# Patient Record
Sex: Male | Born: 1965 | Race: Black or African American | Hispanic: No | Marital: Married | Smoking: Never smoker
Health system: Southern US, Community
[De-identification: ages and names within clinical notes are randomized; demographics above are authoritative.]

## PROBLEM LIST (undated history)

## (undated) DIAGNOSIS — J302 Other seasonal allergic rhinitis: Secondary | ICD-10-CM

---

## 2008-02-24 ENCOUNTER — Encounter: Admission: RE | Admit: 2008-02-24 | Discharge: 2008-02-24 | Payer: Self-pay | Admitting: Occupational Medicine

## 2013-03-31 ENCOUNTER — Emergency Department (INDEPENDENT_AMBULATORY_CARE_PROVIDER_SITE_OTHER)
Admission: EM | Admit: 2013-03-31 | Discharge: 2013-03-31 | Disposition: A | Discharge status: Home / Self Care | Payer: Self-pay | Source: Home / Self Care | Attending: Family Medicine | Admitting: Family Medicine

## 2013-03-31 ENCOUNTER — Encounter: Payer: Self-pay | Admitting: *Deleted

## 2013-03-31 DIAGNOSIS — S6991XA Unspecified injury of right wrist, hand and finger(s), initial encounter: Secondary | ICD-10-CM

## 2013-03-31 DIAGNOSIS — S6980XA Other specified injuries of unspecified wrist, hand and finger(s), initial encounter: Secondary | ICD-10-CM

## 2013-03-31 MED ORDER — CEPHALEXIN 500 MG PO CAPS: 500.0000 mg | ORAL_CAPSULE | Freq: Three times a day (TID) | ORAL | Status: DC

## 2013-03-31 NOTE — ED Notes (Signed)
Patient states he sliced the top of right middle knuckle at work 2 wks ago. Has tried OTC Neosporin and band aid with no relief.

## 2013-03-31 NOTE — ED Provider Notes (Signed)
History     CSN: [id number]  Arrival date & time 03/31/13  1724   First MD Initiated Contact with Patient 03/31/13 1752      Chief Complaint  Patient presents with  . Finger Injury   HPI Finger injury 2 weeks ago  Pt was moving steel and cut dorsal surface of R middle knuckle.  Superficial wound.  Initially placed bandage over area after cleaning.  Area would partially heal and then reopen after pt flexed knuckle.  No fevers, chills.  No purulent drainage, redness, tenderness.   No past medical history on file.  No past surgical history on file.  No family history on file.  History  Substance Use Topics  . Smoking status: Not on file  . Smokeless tobacco: Not on file  . Alcohol Use: Not on file      Review of Systems  All other systems reviewed and are negative.    Allergies  Review of patient's allergies indicates not on file.  Home Medications  No current outpatient prescriptions on file.  There were no vitals taken for this visit.  Physical Exam  Constitutional: He appears well-developed and well-nourished.  HENT:  Head: Normocephalic and atraumatic.  Eyes: Conjunctivae are normal. Pupils are equal, round, and reactive to light.  Neck: Normal range of motion.  Cardiovascular: Normal rate and regular rhythm.   Pulmonary/Chest: Effort normal.  Abdominal: Soft.  Musculoskeletal:       Hands: Partially healed laceration over R middle knuckle with some scar tissue formation.  Non tender  Minimal swelling  Full ROM  Neurovascularly intact distally     Neurological: He is alert.    ED Course  Procedures (including critical care time)  Labs Reviewed - No data to display No results found.   1. Finger injury, right, initial encounter       MDM  Partially healed wound Will place topical antibiotic ointment.  Wrap in flexed position.  Will place on keflex for soft tissue coverage.  Discussed general, derm and [facility] red flags.  Follow up as  needed.     The patient and/or caregiver has been counseled thoroughly with regard to treatment plan and/or medications prescribed including dosage, schedule, interactions, rationale for use, and possible side effects and they verbalize understanding. Diagnoses and expected course of recovery discussed and will return if not improved as expected or if the condition worsens. Patient and/or caregiver verbalized understanding.             Doree Albee, MD 03/31/13 [phone]

## 2013-04-29 ENCOUNTER — Emergency Department (HOSPITAL_COMMUNITY): Payer: Worker's Compensation

## 2013-04-29 ENCOUNTER — Emergency Department (HOSPITAL_COMMUNITY)
Admission: EM | Admit: 2013-04-29 | Discharge: 2013-04-29 | Disposition: A | Discharge status: Home / Self Care | Payer: Worker's Compensation | Attending: Emergency Medicine | Admitting: Emergency Medicine

## 2013-04-29 ENCOUNTER — Encounter (HOSPITAL_COMMUNITY): Payer: Self-pay | Admitting: Emergency Medicine

## 2013-04-29 DIAGNOSIS — Z23 Encounter for immunization: Secondary | ICD-10-CM | POA: Insufficient documentation

## 2013-04-29 DIAGNOSIS — Y990 Civilian activity done for income or pay: Secondary | ICD-10-CM | POA: Insufficient documentation

## 2013-04-29 DIAGNOSIS — S81009A Unspecified open wound, unspecified knee, initial encounter: Secondary | ICD-10-CM | POA: Insufficient documentation

## 2013-04-29 DIAGNOSIS — S81811A Laceration without foreign body, right lower leg, initial encounter: Secondary | ICD-10-CM

## 2013-04-29 DIAGNOSIS — Y929 Unspecified place or not applicable: Secondary | ICD-10-CM | POA: Insufficient documentation

## 2013-04-29 DIAGNOSIS — S91009A Unspecified open wound, unspecified ankle, initial encounter: Secondary | ICD-10-CM | POA: Insufficient documentation

## 2013-04-29 DIAGNOSIS — W298XXA Contact with other powered powered hand tools and household machinery, initial encounter: Secondary | ICD-10-CM | POA: Insufficient documentation

## 2013-04-29 MED ORDER — CEPHALEXIN 500 MG PO CAPS: 500.0000 mg | ORAL_CAPSULE | Freq: Four times a day (QID) | ORAL | Status: AC

## 2013-04-29 MED ORDER — TETANUS-DIPHTH-ACELL PERTUSSIS 5-2.5-18.5 LF-MCG/0.5 IM SUSP
0.5000 mL | Freq: Once | INTRAMUSCULAR | Status: AC
  Administered 2013-04-29: 0.5 mL via INTRAMUSCULAR
  Filled 2013-04-29: qty 0.5

## 2013-04-29 NOTE — ED Provider Notes (Signed)
Patient with laceration at mid shin and anterolateral lower leg. Perpendicular long axis of leg. No soft tissue swelling. Tiny defect in the fascia. X-ray reviewed by me. Patient made aware that he has tiny foreign bodies in soft tissue  Doug Sou, MD 04/29/13 1718

## 2013-04-29 NOTE — ED Provider Notes (Signed)
History  This chart was scribed for Roxy Horseman- PA by Manuela Schwartz, ED scribe. This patient was seen in room TR11C/TR11C and the patient's care was started at 1539.  CSN: [id number] Arrival date & time 04/29/13  1512  First MD Initiated Contact with Patient 04/29/13 1539     Chief Complaint  Patient presents with  . Leg Injury   The history is provided by the patient. No language interpreter was used.   HPI Comments: Brian Holder is a 47 y.o. male who presents to the Emergency Department complaining of right leg laceration. Patient states that he was using a grinder while at work, when he dropped it on his right lower leg. He states that his pain is moderate. Bleeding is mildly controlled. He denies any aggravating or alleviating factors. He has not tried taking anything to alleviate his symptoms. His last tetanus shot is unknown.     History reviewed. No pertinent past medical history. History reviewed. No pertinent past surgical history. Family History  Problem Relation Age of Onset  . Hypertension Mother   . Diabetes Mother   . Hypertension Father    History  Substance Use Topics  . Smoking status: Never Smoker   . Smokeless tobacco: Not on file  . Alcohol Use: No    Review of Systems  Constitutional: Negative for fever and chills.  Respiratory: Negative for shortness of breath.   Gastrointestinal: Negative for nausea and vomiting.  Neurological: Negative for weakness.  All other systems reviewed and are negative.   A complete 10 system review of systems was obtained and all systems are negative except as noted in the HPI and PMH.   Allergies  Review of patient's allergies indicates no known allergies.  Home Medications   Current Outpatient Rx  Name  Route  Sig  Dispense  Refill  . ibuprofen (ADVIL,MOTRIN) 600 MG tablet   Oral   Take 600 mg by mouth every 6 (six) hours as needed for pain.          Triage Vitals: BP 139/84  Temp(Src) 98 F (36.7 C)  (Oral)  Resp 18  Ht 5' 10.5" (1.791 m)  Wt 223 lb (101.152 kg)  BMI 31.53 kg/m2  SpO2 97% Physical Exam  Nursing note and vitals reviewed. Constitutional: He is oriented to person, place, and time. He appears well-developed and well-nourished. No distress.  HENT:  Head: Normocephalic and atraumatic.  Right Ear: External ear normal.  Left Ear: External ear normal.  Nose: Nose normal.  Mouth/Throat: Oropharynx is clear and moist. No oropharyngeal exudate.  Eyes: Conjunctivae and EOM are normal. Pupils are equal, round, and reactive to light. Right eye exhibits no discharge. Left eye exhibits no discharge. No scleral icterus.  Neck: Normal range of motion. Neck supple. No JVD present. No tracheal deviation present.  Cardiovascular: Normal rate, regular rhythm, normal heart sounds and intact distal pulses.  Exam reveals no gallop and no friction rub.   No murmur heard. Brisk capillary refill  Pulmonary/Chest: Effort normal and breath sounds normal. No respiratory distress. He has no wheezes. He has no rales. He exhibits no tenderness.  Abdominal: Soft. Bowel sounds are normal. He exhibits no distension and no mass. There is no tenderness. There is no rebound and no guarding.  Musculoskeletal: Normal range of motion. He exhibits no edema and no tenderness.  Right ankle flexion and extension 5/5, range of motion 5/5  Neurological: He is alert and oriented to person, place, and time. He has normal  reflexes.  CN 3-12 intact  Skin: Skin is warm and dry.  Laceration to right shin, approximately 5 cm, also involving the underlying tissue, including partial laceration of anterior tibialis,  Psychiatric: He has a normal mood and affect. His behavior is normal. Judgment and thought content normal.    ED Course  Procedures (including critical care time) DIAGNOSTIC STUDIES: Oxygen Saturation is 97% on room air, normal by my interpretation.    COORDINATION OF CARE: Discussed treatment plan with  patient which includes right fib/tib X-ray, Tdap injection. Patient agrees.   LACERATION REPAIR Performed by: Roxy Horseman Authorized by: Roxy Horseman Consent: Verbal consent obtained. Risks and benefits: risks, benefits and alternatives were discussed Consent given by: patient Patient identity confirmed: provided demographic data Prepped and Draped in normal sterile fashion Wound explored  Laceration Location: Right shin  Laceration Length: 5 cm  3 Foreign Bodies seen and removed no other foreign bodies seen or palpated  Anesthesia: local infiltration  Local anesthetic: lidocaine 2 % with epinephrine  Anesthetic total: 10 ml  Irrigation method: syringe Amount of cleaning: standard  Skin closure: 4-0   Number of sutures: 6   Technique: Horizontal mattress   Patient tolerance: Patient tolerated the procedure well with no immediate complications.  Labs Reviewed - No data to display No results found for this or any previous visit. Dg Tibia/fibula Right  04/29/2013   *RADIOLOGY REPORT*  Clinical Data: Laceration of the tibia from metal grinder.  Rule out foreign bodies/bony involvement.  RIGHT TIBIA AND FIBULA - 2 VIEW  Comparison: None.  Findings: Along the lower mid anterior aspect of the lower leg, there is soft tissue laceration, associated with small pieces of metallic debris.  No fracture or bony laceration.  Visualized portions of the knee and ankle are normal.  IMPRESSION: Soft tissue laceration and metallic debris.  No fracture.   Original Report Authenticated By: Norva Pavlov, M.D.     1. Leg laceration, right, initial encounter     MDM   Patient with right shin laceration, also mildly involving the anterior tibialis. Patient has full range of motion of right foot and ankle. He is neurovascularly intact. With brisk capillary refill and intact distal pulses. The patient has been seen by and discussed with Dr. Rennis Chris, who instructs me to close the  overlying laceration, and discharge home with Keflex. Patient is to followup with occupational health in 2 days. Tetanus is updated.     Roxy Horseman, PA-C 04/29/13 1907

## 2013-04-29 NOTE — ED Notes (Signed)
Pt cut right lower leg at work with grinder

## 2013-04-30 NOTE — ED Provider Notes (Signed)
Medical screening examination/treatment/procedure(s) were conducted as a shared visit with non-physician practitioner(s) and myself.  I personally evaluated the patient during the encounter  Doug Sou, MD 04/30/13 [phone]

## 2014-02-19 ENCOUNTER — Emergency Department
Admission: EM | Admit: 2014-02-19 | Discharge: 2014-02-19 | Disposition: A | Discharge status: Home / Self Care | Payer: Self-pay | Source: Home / Self Care | Attending: Emergency Medicine | Admitting: Emergency Medicine

## 2014-02-19 ENCOUNTER — Emergency Department (INDEPENDENT_AMBULATORY_CARE_PROVIDER_SITE_OTHER): Payer: Self-pay

## 2014-02-19 ENCOUNTER — Encounter: Payer: Self-pay | Admitting: Emergency Medicine

## 2014-02-19 DIAGNOSIS — S62636A Displaced fracture of distal phalanx of right little finger, initial encounter for closed fracture: Secondary | ICD-10-CM

## 2014-02-19 DIAGNOSIS — W208XXA Other cause of strike by thrown, projected or falling object, initial encounter: Secondary | ICD-10-CM

## 2014-02-19 DIAGNOSIS — S62639A Displaced fracture of distal phalanx of unspecified finger, initial encounter for closed fracture: Secondary | ICD-10-CM

## 2014-02-19 HISTORY — DX: Other seasonal allergic rhinitis: J30.2

## 2014-02-19 MED ORDER — AMOXICILLIN-POT CLAVULANATE 875-125 MG PO TABS: 1.0000 | ORAL_TABLET | Freq: Two times a day (BID) | ORAL | Status: AC

## 2014-02-19 MED ORDER — MELOXICAM 7.5 MG PO TABS: ORAL_TABLET | ORAL | Status: AC

## 2014-02-19 MED ORDER — HYDROCODONE-ACETAMINOPHEN 5-325 MG PO TABS: 1.0000 | ORAL_TABLET | ORAL | Status: AC | PRN

## 2014-02-19 NOTE — ED Provider Notes (Signed)
CSN: [id number]     Arrival date & time 02/19/14  1629 History   First MD Initiated Contact with Patient 02/19/14 1729     Chief Complaint  Patient presents with  . Finger Injury    HPI Patient reports accidentally injuring/crushing right 5th and 4th fingers 3 days ago, when a heavy piece of metal fell on right hand.--Other than the fourth and fifth fingers, he denies any symptoms in the right hand.  He states that he did not seek care initially, thinking it would improve. Now has continued moderate, shart pain with limited ROM of fifth finger and some edema and redness. Pain exacerbated by bumping it or gripping. Has not tried any OTC for relief . He reports that he initially had a blood blister right fourth finger that he popped himself and a small amount of dried blood came out. Minimal swelling and pain right fourth finger. No drainage or recurrence of bleeding. Denies numbness or weakness.  Denies fever or chills or numbness or weakness  Immunizations up-to-date  Past Medical History  Diagnosis Date  . Seasonal allergies    History reviewed. No pertinent past surgical history. Family History  Problem Relation Age of Onset  . Hypertension Mother   . Diabetes Mother   . Hypertension Father    History  Substance Use Topics  . Smoking status: Never Smoker   . Smokeless tobacco: Not on file  . Alcohol Use: No    Review of Systems  All other systems reviewed and are negative.   Allergies  Review of patient's allergies indicates no known allergies.  Home Medications   Prior to Admission medications   Medication Sig Start Date End Date Taking? Authorizing Provider  amoxicillin-clavulanate (AUGMENTIN) 875-125 MG per tablet Take 1 tablet by mouth every 12 (twelve) hours. Take for 7 days. Take with food.--This is an antibiotic 02/19/14   Lajean Manesavid Massey, MD  cephALEXin (KEFLEX) 500 MG capsule Take 1 capsule (500 mg total) by mouth 4 (four) times daily. 04/29/13   Roxy Horsemanobert Browning,  PA-C  HYDROcodone-acetaminophen (NORCO/VICODIN) 5-325 MG per tablet Take 1-2 tablets by mouth every 4 (four) hours as needed for severe pain. Take with food. 02/19/14   Lajean Manesavid Massey, MD  ibuprofen (ADVIL,MOTRIN) 600 MG tablet Take 600 mg by mouth every 6 (six) hours as needed for pain.    Historical Provider, MD  meloxicam (MOBIC) 7.5 MG tablet Take 1 twice a day as needed for moderate pain. Take with food. (Do not take with any other NSAID.) 02/19/14   Lajean Manesavid Massey, MD   BP 135/86  Pulse 90  Temp(Src) 98.3 F (36.8 C) (Oral)  Resp 16  Ht 5' 10.5" (1.791 m)  Wt 226 lb (102.513 kg)  BMI 31.96 kg/m2  SpO2 98% Physical Exam  Nursing note and vitals reviewed. Constitutional: He is oriented to person, place, and time. He appears well-developed and well-nourished. No distress.  Uncomfortable from painful right fourth and fifth fingers, but no acute distress  HENT:  Head: Normocephalic and atraumatic.  Eyes: Conjunctivae and EOM are normal. Pupils are equal, round, and reactive to light. No scleral icterus.  Neck: Normal range of motion.  Cardiovascular: Normal rate.   Pulmonary/Chest: Effort normal.  Abdominal: He exhibits no distension.  Musculoskeletal: Normal range of motion.       Right wrist: Normal.       Hands: Right fourth finger: Small healing abrasion with surrounding erythema and minimal tenderness. No fluctuance or induration. Range of motion intact. Nail  normal. Neurovascular intact. Tendons intact.  Right fifth finger: Decreased range of motion because of pain and swelling. Exquisitely tender over distal phalanx. Fingernail intact without subungual hematoma. On the palmar aspect of distal phalanx, mild ecchymosis and tenseness of the skin, but sensation and capillary refill within normal limits. Tendons intact. Motor intact  Neurological: He is alert and oriented to person, place, and time.  Skin: Skin is warm.  Psychiatric: He has a normal mood and affect.    ED Course    Procedures (including critical care time) Labs Review Labs Reviewed - No data to display  Imaging Review Dg Hand Complete Right  02/19/2014   CLINICAL DATA:  Injury  EXAM: RIGHT HAND - COMPLETE 3+ VIEW  COMPARISON:  None.  FINDINGS: A nondisplaced fracture involving the distal tufts fifth digit.  IMPRESSION: Distal tuft fracture fifth digit.   Electronically Signed   By: Salome HolmesHector  Cooper M.D.   On: 02/19/2014 17:19     MDM   1. Fracture of fifth finger, distal phalanx, right, closed    Nondisplaced comminuted fracture of distal tuft of fifth finger. Also, healing abrasion right fourth finger.  Treatment options discussed, as well as risks, benefits, alternatives. Anticipatory guidance discussed. Patient voiced understanding and agreement with the following plans: We fashioned a palmar and volar cushioned finger splint right distal fifth finger. May not grip at work for the next week. Mobic prescribed as needed for moderate pain Vicodin, #12. No refills. As needed for severe pain, precautions discussed. Antibiotic coverage with Augmentin twice a day x7 days  Follow-up with your orthopedist in 5-7 days if not improving, or sooner if symptoms become worse. Precautions discussed. Red flags discussed. Questions invited and answered. Patient voiced understanding and agreement.    Lajean Manesavid Massey, MD 02/19/14 [phone]

## 2014-02-19 NOTE — ED Notes (Signed)
Patient reports injuring/crushing left pinky and ring fingers 4 days ago at work; did not seek care thinking it would improve. Now has continued pain with limited ROM of pinky finger and some edema and redness; no skin break. No recent OTCs.

## 2015-04-15 IMAGING — CR DG HAND COMPLETE 3+V*R*
3 series · 3 of 3 positions shown · non-contrast
Comparison: None.

CLINICAL DATA: Injury

EXAM:
RIGHT HAND - COMPLETE 3+ VIEW

[view not recorded (1 of 3)]
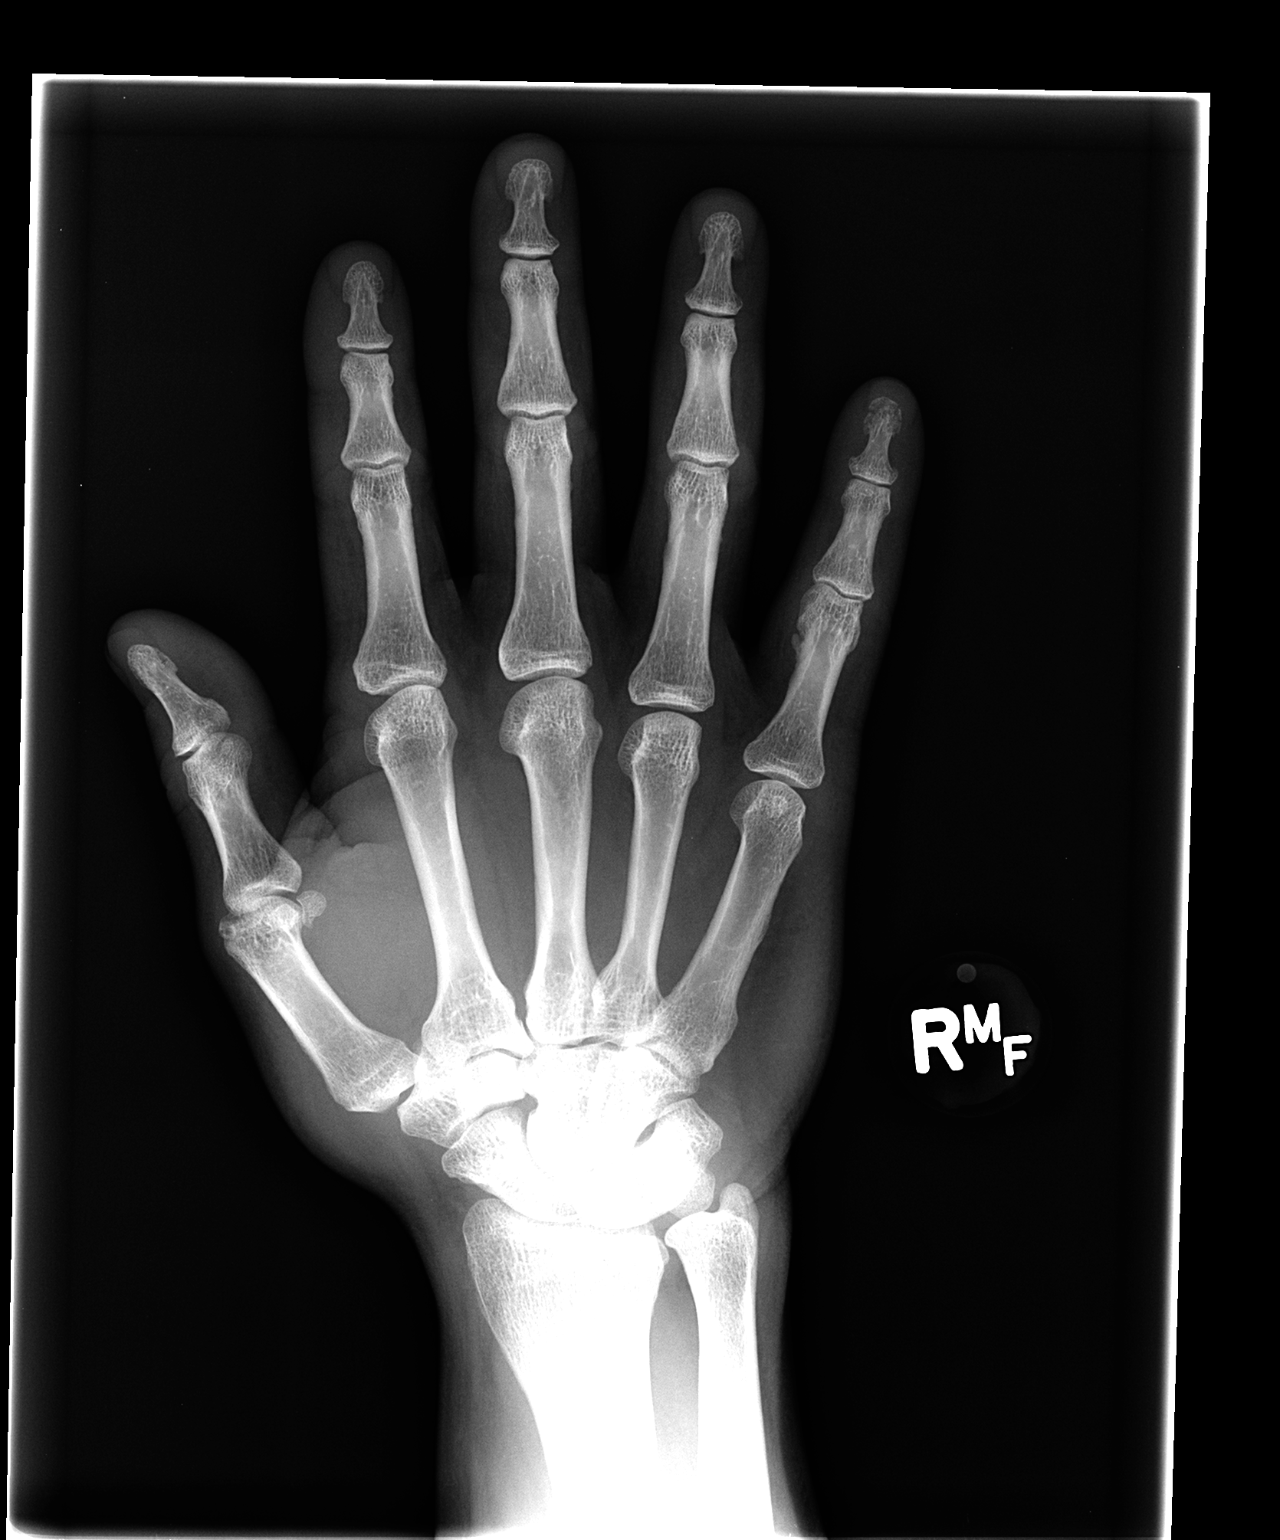

[view not recorded (2 of 3)]
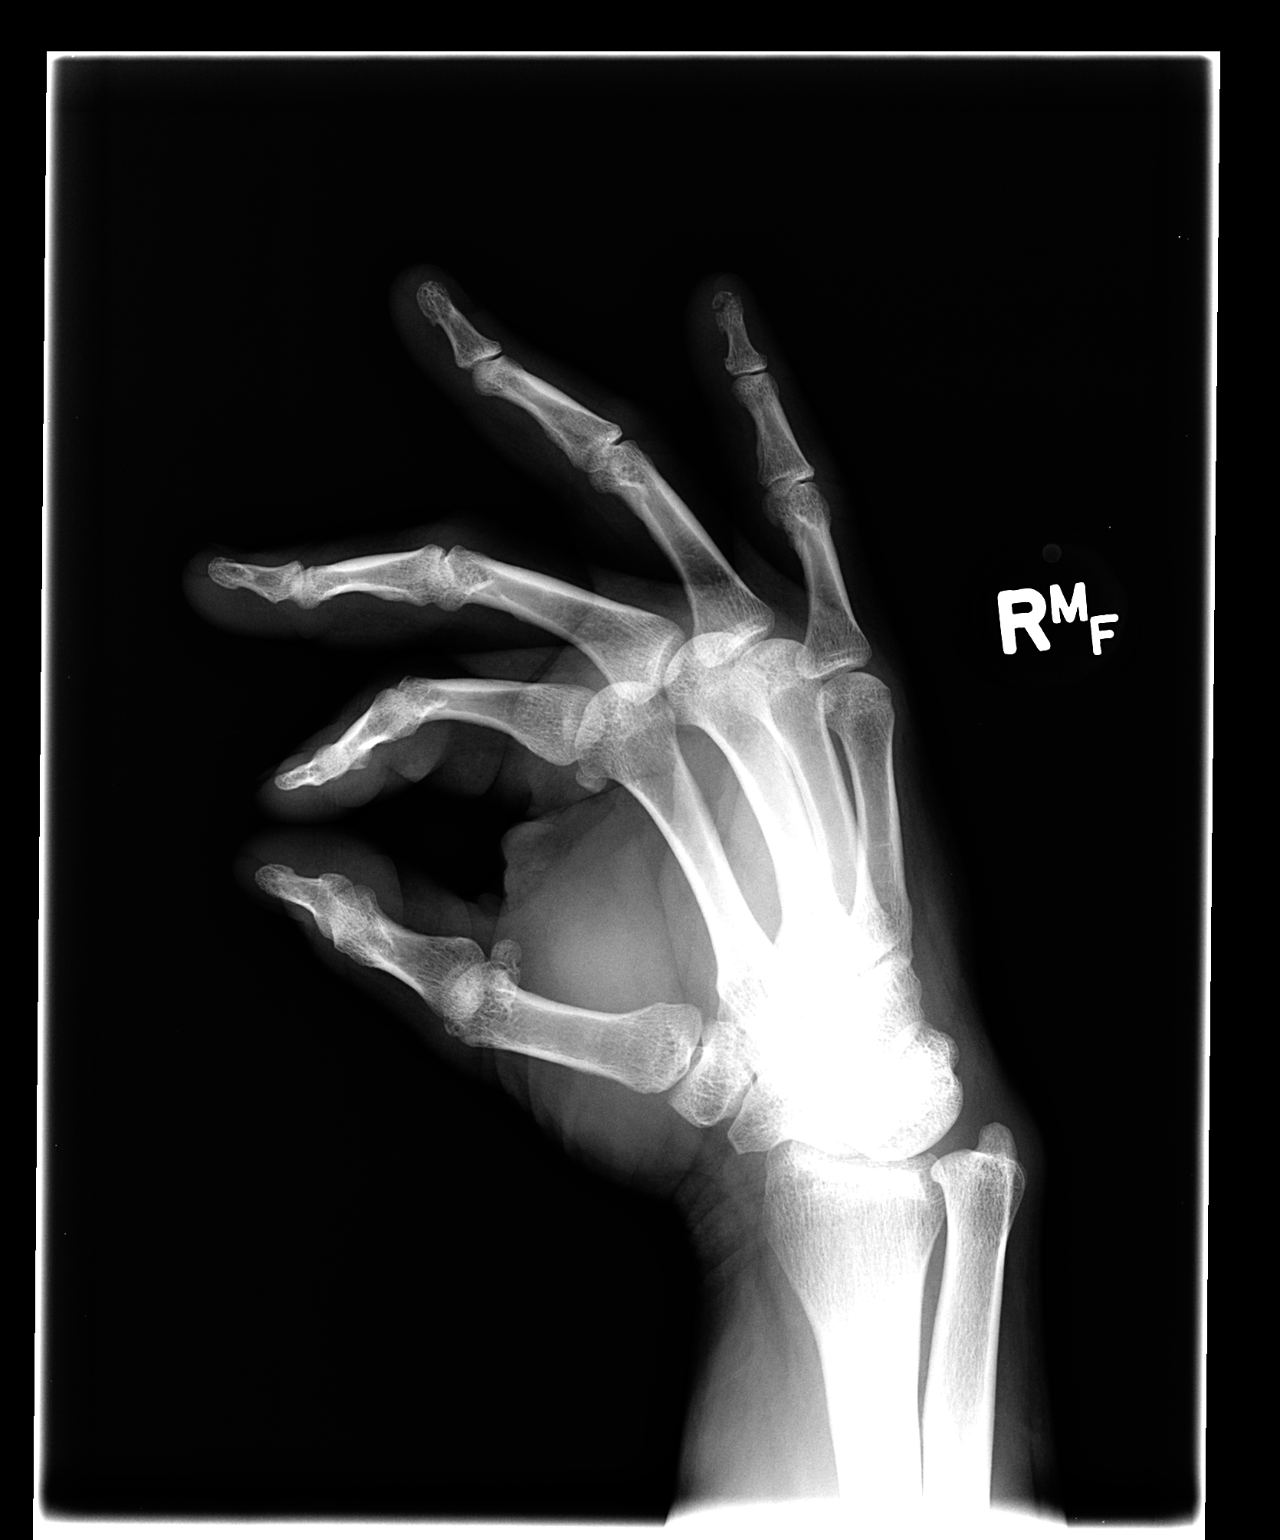

[view not recorded (3 of 3)]
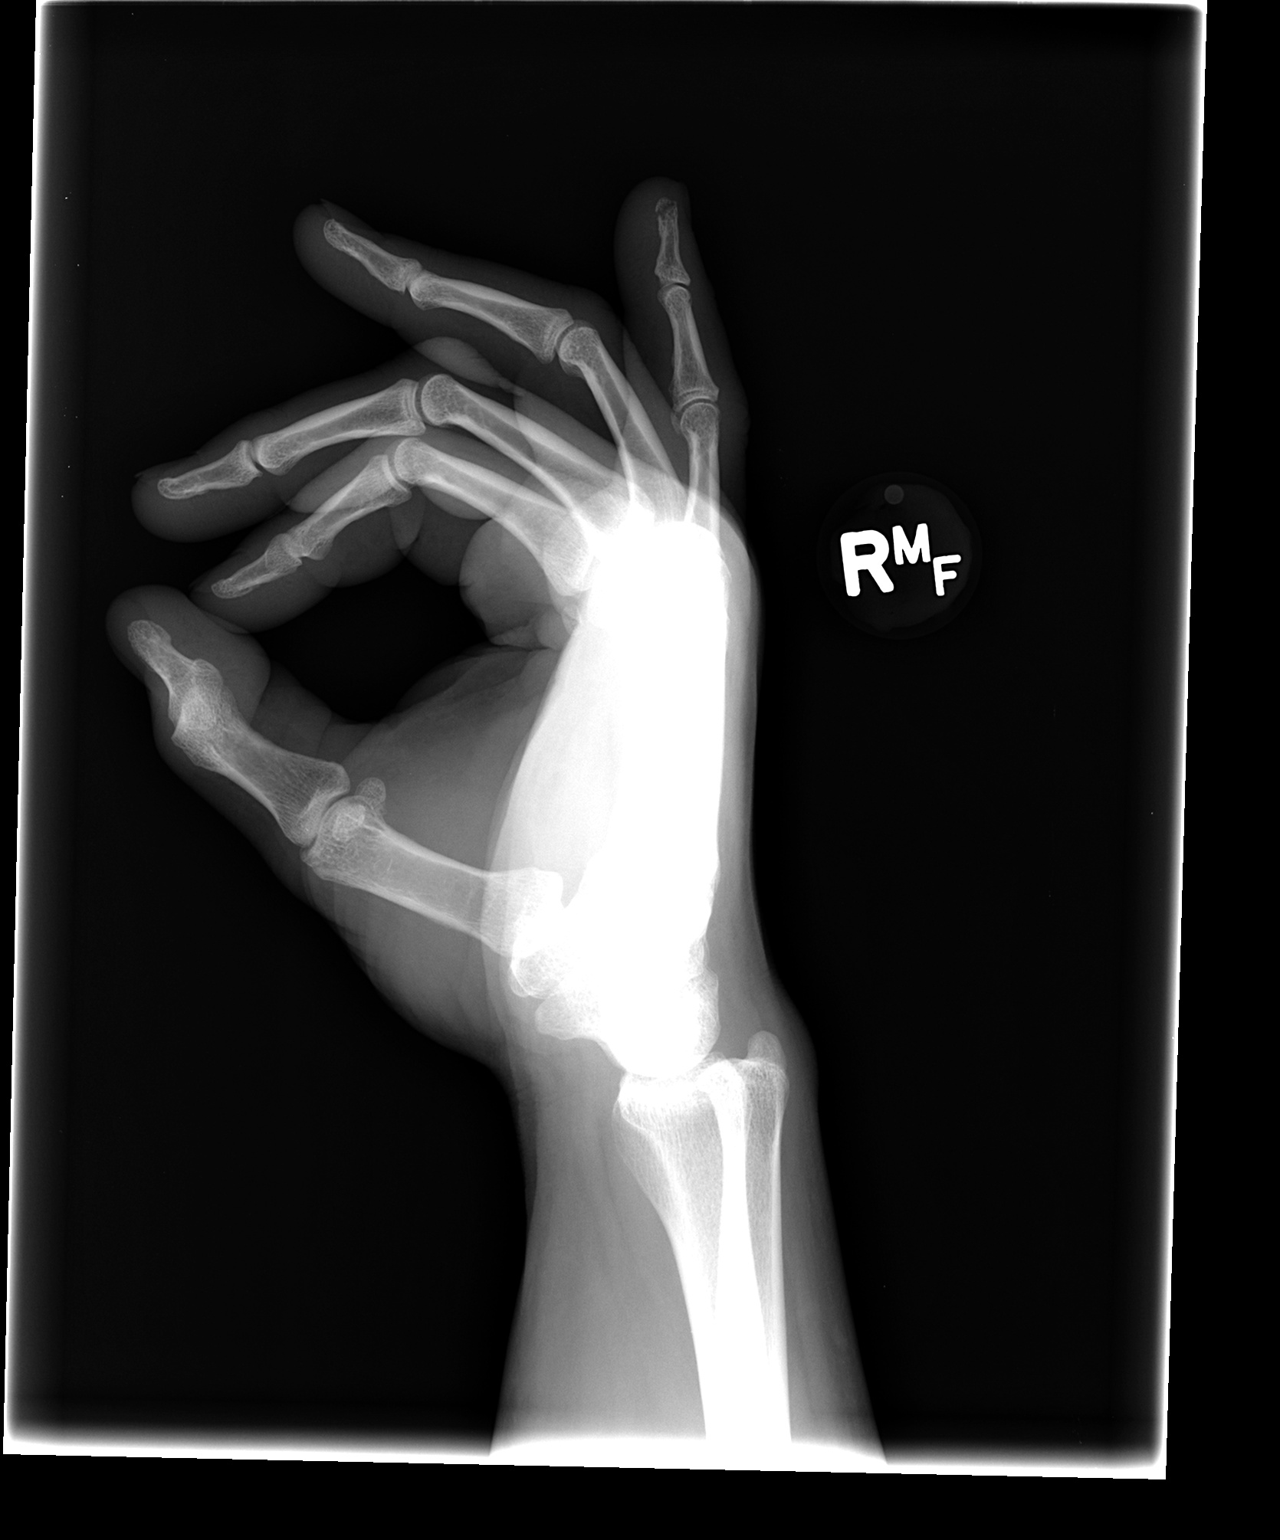

[3 of 3 positions shown; findings below may reference images not displayed]

FINDINGS: A nondisplaced fracture involving the distal [REDACTED] fifth digit.
IMPRESSION: Distal tuft fracture fifth digit.
